# Patient Record
Sex: Female | Born: 1995 | Race: Black or African American | Hispanic: No | Marital: Single | Smoking: Never smoker
Health system: Southern US, Community
[De-identification: ages and names within clinical notes are randomized; demographics above are authoritative.]

## PROBLEM LIST (undated history)

## (undated) DIAGNOSIS — R109 Unspecified abdominal pain: Secondary | ICD-10-CM

## (undated) DIAGNOSIS — R55 Syncope and collapse: Secondary | ICD-10-CM

## (undated) DIAGNOSIS — K589 Irritable bowel syndrome without diarrhea: Secondary | ICD-10-CM

## (undated) DIAGNOSIS — G8929 Other chronic pain: Secondary | ICD-10-CM

## (undated) HISTORY — PX: NO PAST SURGERIES: SHX2092

---
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
## 2016-10-03 ENCOUNTER — Encounter (HOSPITAL_COMMUNITY): Payer: Self-pay | Admitting: *Deleted

## 2016-10-03 ENCOUNTER — Inpatient Hospital Stay (HOSPITAL_COMMUNITY)
Admission: AD | Admit: 2016-10-03 | Discharge: 2016-10-03 | Disposition: A | Discharge status: Home / Self Care | Payer: Managed Care, Other (non HMO) | Source: Ambulatory Visit | Attending: Obstetrics & Gynecology | Admitting: Obstetrics & Gynecology

## 2016-10-03 DIAGNOSIS — R112 Nausea with vomiting, unspecified: Secondary | ICD-10-CM

## 2016-10-03 DIAGNOSIS — K529 Noninfective gastroenteritis and colitis, unspecified: Secondary | ICD-10-CM | POA: Diagnosis not present

## 2016-10-03 HISTORY — DX: Unspecified abdominal pain: R10.9

## 2016-10-03 HISTORY — DX: Other chronic pain: G89.29

## 2016-10-03 LAB — URINALYSIS, ROUTINE W REFLEX MICROSCOPIC
BILIRUBIN URINE: NEGATIVE
Glucose, UA: NEGATIVE mg/dL
Hgb urine dipstick: NEGATIVE
Ketones, ur: 5 mg/dL — AB
LEUKOCYTES UA: NEGATIVE
Nitrite: NEGATIVE
PH: 6 (ref 5.0–8.0)
Protein, ur: NEGATIVE mg/dL
Specific Gravity, Urine: 1.021 (ref 1.005–1.030)

## 2016-10-03 LAB — BASIC METABOLIC PANEL
Anion gap: 8 (ref 5–15)
BUN: 10 mg/dL (ref 6–20)
CO2: 26 mmol/L (ref 22–32)
Calcium: 9.6 mg/dL (ref 8.9–10.3)
Chloride: 104 mmol/L (ref 101–111)
Creatinine, Ser: 0.77 mg/dL (ref 0.44–1.00)
GFR calc Af Amer: >60 mL/min (ref >60)
GFR calc non Af Amer: >60 mL/min (ref >60)
GLUCOSE: 107 mg/dL — AB (ref 65–99)
Potassium: 3.6 mmol/L (ref 3.5–5.1)
Sodium: 138 mmol/L (ref 135–145)

## 2016-10-03 LAB — POCT PREGNANCY, URINE: Preg Test, Ur: NEGATIVE

## 2016-10-03 LAB — CBC
HCT: 40.5 % (ref 36.0–46.0)
Hemoglobin: 14.3 g/dL (ref 12.0–15.0)
MCH: 29 pg (ref 26.0–34.0)
MCHC: 35.3 g/dL (ref 30.0–36.0)
MCV: 82.2 fL (ref 78.0–100.0)
Platelets: 348 10*3/uL (ref 150–400)
RBC: 4.93 MIL/uL (ref 3.87–5.11)
RDW: 13.3 % (ref 11.5–15.5)
WBC: 5.3 10*3/uL (ref 4.0–10.5)

## 2016-10-03 MED ORDER — ONDANSETRON 4 MG PO TBDP: 4.0000 mg | ORAL_TABLET | Freq: Three times a day (TID) | ORAL | 0 refills | Status: AC | PRN

## 2016-10-03 MED ORDER — ONDANSETRON 8 MG PO TBDP
8.0000 mg | ORAL_TABLET | Freq: Once | ORAL | Status: AC
  Administered 2016-10-03: 8 mg via ORAL
  Filled 2016-10-03: qty 1

## 2016-10-03 NOTE — MAU Note (Signed)
2 days ago awoke and had episodes of N/V Still nauseous no vomiting since then +abdominal pain--states has had chronic abdominal pain LMP 2 months ago--on Depo Denies vaginal bleeding or discharge

## 2016-10-03 NOTE — MAU Provider Note (Signed)
History     CSN: [id number]  Arrival date and time: 10/03/16 [id number]  First Provider Initiated Contact with Patient 10/03/16 [phone]      Chief Complaint  Patient presents with  . Abdominal Pain  . Nausea  . Emesis   HPI Monica DouglasKianna Torres is a 21 y.o. female who presents for abdominal pain, n/v, and diarrhea. This is a chronic problem that started over a year ago. Patient has been evaluated at [facility] & urgent care where she had a negative abdominal xray. Reports daily lower abdominal pains accompanied by brown watery stool. Has 1-2 watery stools per day. Unaffected by medication, stress, or diet change. Denies unexplained weight loss.  Nausea & vomiting began 3 days ago. Last time she vomited was yesterday but continues to be nauseated. Has not treated symptoms.  Denies fever/chills, vaginal bleeding, vaginal discharge, dysuria, hematochezia, sick contacts, recent abx usage, or recent hospitalization. Uses depo provera for contraception.   Past Medical History:  Diagnosis Date  . Chronic abdominal pain     Past Surgical History:  Procedure Laterality Date  . NO PAST SURGERIES      History reviewed. No pertinent family history.  Social History  Substance Use Topics  . Smoking status: Never Smoker  . Smokeless tobacco: Never Used  . Alcohol use No    Allergies:  Allergies  Allergen Reactions  . Benzocaine Anaphylaxis    Mouth swelling   . Fruit & Vegetable Daily [Nutritional Supplements] Rash    Prescriptions Prior to Admission  Medication Sig Dispense Refill Last Dose  . ibuprofen (ADVIL,MOTRIN) 200 MG tablet Take 400 mg by mouth every 6 (six) hours as needed for moderate pain.   06/17/2014 at Unknown time  . medroxyPROGESTERone (DEPO-PROVERA) 150 MG/ML injection Inject 150 mg into the muscle.     . [DISCONTINUED] ciprofloxacin (CIPRO) 500 MG tablet Take 500 mg by mouth 2 (two) times daily.    06/17/2014 at Unknown time  . [DISCONTINUED]  HYDROcodone-acetaminophen (NORCO/VICODIN) 5-325 MG per tablet Take 1-2 tablets by mouth every 4 (four) hours as needed for moderate pain or severe pain. 6 tablet 0   . [DISCONTINUED] metroNIDAZOLE (FLAGYL) 500 MG tablet Take 1 tablet (500 mg total) by mouth 2 (two) times daily. 14 tablet 0   . [DISCONTINUED] phenazopyridine (PYRIDIUM) 200 MG tablet Take 200 mg by mouth 2 (two) times daily as needed for pain.    06/17/2014 at Unknown time    Review of Systems  Constitutional: Negative.   Gastrointestinal: Positive for abdominal pain, diarrhea, nausea and vomiting. Negative for abdominal distention, anal bleeding, blood in stool, constipation and rectal pain.  Endocrine: Negative for cold intolerance and heat intolerance.  Genitourinary: Negative.   Musculoskeletal: Negative for myalgias.  Psychiatric/Behavioral: The patient is not nervous/anxious.    Physical Exam   Blood pressure 128/81, pulse 90, temperature 98.1 F (36.7 C), temperature source Oral, resp. rate 16, weight 134 lb 0.6 oz (60.8 kg), SpO2 100 %.  Physical Exam  Nursing note and vitals reviewed. Constitutional: She is oriented to person, place, and time. She appears well-developed and well-nourished. No distress.  HENT:  Head: Normocephalic and atraumatic.  Eyes: Conjunctivae are normal. Right eye exhibits no discharge. Left eye exhibits no discharge. No scleral icterus.  Neck: Normal range of motion.  Cardiovascular: Normal rate, regular rhythm and normal heart sounds.   No murmur heard. Respiratory: Effort normal and breath sounds normal. No respiratory distress. She has no wheezes.  GI: Soft.  Bowel sounds are normal. She exhibits no mass. There is no tenderness. There is no rebound and no guarding.  Neurological: She is alert and oriented to person, place, and time.  Skin: Skin is warm and dry. She is not diaphoretic.  Psychiatric: She has a normal mood and affect. Her behavior is normal. Judgment and thought content  normal.    MAU Course  Procedures Results for orders placed or performed during the hospital encounter of 10/03/16 (from the past 24 hour(s))  Urinalysis, Routine w reflex microscopic     Status: Abnormal   Collection Time: 10/03/16  8:47 AM  Result Value Ref Range   Color, Urine YELLOW YELLOW   APPearance CLEAR CLEAR   Specific Gravity, Urine 1.021 1.005 - 1.030   pH 6.0 5.0 - 8.0   Glucose, UA NEGATIVE NEGATIVE mg/dL   Hgb urine dipstick NEGATIVE NEGATIVE   Bilirubin Urine NEGATIVE NEGATIVE   Ketones, ur 5 (A) NEGATIVE mg/dL   Protein, ur NEGATIVE NEGATIVE mg/dL   Nitrite NEGATIVE NEGATIVE   Leukocytes, UA NEGATIVE NEGATIVE  Pregnancy, urine POC     Status: None   Collection Time: 10/03/16  9:20 AM  Result Value Ref Range   Preg Test, Ur NEGATIVE NEGATIVE  CBC     Status: None   Collection Time: 10/03/16 10:20 AM  Result Value Ref Range   WBC 5.3 4.0 - 10.5 K/uL   RBC 4.93 3.87 - 5.11 MIL/uL   Hemoglobin 14.3 12.0 - 15.0 g/dL   HCT 96.0 45.4 - 09.8 %   MCV 82.2 78.0 - 100.0 fL   MCH 29.0 26.0 - 34.0 pg   MCHC 35.3 30.0 - 36.0 g/dL   RDW 11.9 14.7 - 82.9 %   Platelets 348 150 - 400 K/uL  Basic metabolic panel     Status: Abnormal   Collection Time: 10/03/16 10:20 AM  Result Value Ref Range   Sodium 138 135 - 145 mmol/L   Potassium 3.6 3.5 - 5.1 mmol/L   Chloride 104 101 - 111 mmol/L   CO2 26 22 - 32 mmol/L   Glucose, Bld 107 (H) 65 - 99 mg/dL   BUN 10 6 - 20 mg/dL   Creatinine, Ser 5.62 0.44 - 1.00 mg/dL   Calcium 9.6 8.9 - 13.0 mg/dL   GFR calc non Af Amer >60 >60 mL/min   GFR calc Af Amer >60 >60 mL/min   Anion gap 8 5 - 15    MDM UPT negative VSS, NAD U/a CBC, BMP Zofran 8 mg ODT No vomiting in MAU & able to tolerate POs Discussed importance of f/u with PCP for further work up & possible GI referral. Pt agreeable to being discharged w/rx for antiemetic.  Assessment and Plan  A: 1. Non-intractable vomiting with nausea, unspecified vomiting type   2.  Chronic diarrhea    P: Discharge home Rx zofran Discussed reasons to return to MAU vs ED Establish care with PCP  Monica Torres 10/03/2016, 9:11 AM

## 2016-10-03 NOTE — Discharge Instructions (Signed)
Nausea and Vomiting, Adult Feeling sick to your stomach (nausea) means that your stomach is upset or you feel like you have to throw up (vomit). Feeling more and more sick to your stomach can lead to throwing up. Throwing up happens when food and liquid from your stomach are thrown up and out the mouth. Throwing up can make you feel weak and cause you to get dehydrated. Dehydration can make you tired and thirsty, make you have a dry mouth, and make it so you pee (urinate) less often. Older adults and people with other diseases or a weak defense system (immune system) are at higher risk for dehydration. If you feel sick to your stomach or if you throw up, it is important to follow instructions from your doctor about how to take care of yourself. Follow these instructions at home: Eating and drinking  Follow these instructions as told by your doctor:  Take an oral rehydration solution (ORS). This is a drink that is sold at pharmacies and stores.  Drink clear fluids in small amounts as you are able, such as:  Water.  Ice chips.  Diluted fruit juice.  Low-calorie sports drinks.  Eat bland, easy-to-digest foods in small amounts as you are able, such as:  Bananas.  Applesauce.  Rice.  Low-fat (lean) meats.  Toast.  Crackers.  Avoid fluids that have a lot of sugar or caffeine in them.  Avoid alcohol.  Avoid spicy or fatty foods. General instructions   Drink enough fluid to keep your pee (urine) clear or pale yellow.  Wash your hands often. If you cannot use soap and water, use hand sanitizer.  Make sure that all people in your home wash their hands well and often.  Take over-the-counter and prescription medicines only as told by your doctor.  Rest at home while you get better.  Watch your condition for any changes.  Breathe slowly and deeply when you feel sick to your stomach.  Keep all follow-up visits as told by your doctor. This is important. Contact a doctor  if:  You have a fever.  You cannot keep fluids down.  Your symptoms get worse.  You have new symptoms.  You feel sick to your stomach for more than two days.  You feel light-headed or dizzy.  You have a headache.  You have muscle cramps. Get help right away if:  You have pain in your chest, neck, arm, or jaw.  You feel very weak or you pass out (faint).  You throw up again and again.  You see blood in your throw-up.  Your throw-up looks like black coffee grounds.  You have bloody or black poop (stools) or poop that look like tar.  You have a very bad headache, a stiff neck, or both.  You have a rash.  You have very bad pain, cramping, or bloating in your belly (abdomen).  You have trouble breathing.  You are breathing very quickly.  Your heart is beating very quickly.  Your skin feels cold and clammy.  You feel confused.  You have pain when you pee.  You have signs of dehydration, such as:  Dark pee, hardly any pee, or no pee.  Cracked lips.  Dry mouth.  Sunken eyes.  Sleepiness.  Weakness. These symptoms may be an emergency. Do not wait to see if the symptoms will go away. Get medical help right away. Call your local emergency services (911 in the [place] Do not drive yourself to the hospital. This information   is not intended to replace advice given to you by your health care provider. Make sure you discuss any questions you have with your health care provider. Document Released: 10/15/2007 Document Revised: 11/16/2015 Document Reviewed: 01/02/2015 Elsevier Interactive Patient Education  2017 [employer]    Chronic Diarrhea Diarrhea is a condition in which a person passes frequent loose and watery stools. It can cause you to feel weak and dehydrated. Dehydration can make you tired and thirsty. It can also cause a dry mouth, decreased urination, and dark yellow urine. Diarrhea is a sign of another underlying problem, such  as:  Infection.  Medication side effects.  Dietary intolerance, such as lactose intolerance.  Conditions such as celiac disease, irritable bowel syndrome (IBS), or inflammatory bowel disease (IBD). In most cases, diarrhea lasts 2-3 days. Diarrhea that lasts longer than 4 weeks is called long-lasting (chronic) diarrhea. It is important that you treat your diarrhea as told by your health care provider. Follow these instructions at home: Follow these recommendations as told by your health care provider. Eating and drinking   Take an oral rehydration solution (ORS). This is a drink that is designed to keep you hydrated. It can be found at pharmacies and retail stores.  Drink clear fluids, such as water, ice chips, diluted fruit juice, and low-calorie sports drinks.  Follow the diet recommended by your health care provider. You may need to avoid foods that trigger diarrhea for you.  Avoid foods and beverages that contain a lot of sugar or caffeine.  Avoid alcohol.  Avoid spicy or fatty foods. General instructions   Drink enough fluid to keep your urine clear or pale yellow.  Wash your hands often and after each diarrhea episode. If soap and water are not available, use hand sanitizer.  Make sure that all people in your household wash their hands well and often.  Take over-the-counter and prescription medicines only as told by your health care provider.  If you were prescribed an antibiotic medicine, take it as told by your health care provider. Do not stop taking the antibiotic even if you start to feel better.  Rest at home while you recover.  Watch your condition for any changes.  Take a warm bath to relieve any burning or pain from frequent diarrhea episodes.  Keep all follow-up visits as told by your health care provider. This is important. Contact a health care provider if:  You have a fever.  Your diarrhea gets worse or does not get better.  You have new  symptoms.  You cannot drink fluids without vomiting.  You feel light-headed or dizzy.  You have a headache.  You have muscle cramps.  You have severe pain in the rectum. Get help right away if:  You have persistent vomiting.  You have chest pain.  You feel extremely weak or you faint.  You have bloody or black stools, or stools that look like tar.  You have severe pain, cramping, or bloating in your abdomen, or pain that stays in one place.  You have trouble breathing or you are breathing very quickly.  Your heart is beating very quickly.  Your skin feels cold and clammy.  You feel confused.  You have a severe headache.  You have signs of dehydration, such as:  Dark urine, very little urine, or no urine.  Cracked lips.  Dry mouth.  Sunken eyes.  Sleepiness.  Weakness. Summary  Chronic diarrhea is a condition in which a person passes frequent loose and  watery stools for more than 4 weeks.  Diarrhea is a sign of another underlying problem.  Drink enough fluid to keep your urine clear or pale yellow to avoid dehydration.  Wash your hands often and after each diarrhea episode. If soap and water are not available, use hand sanitizer.  It is important that you treat your diarrhea as told by your health care provider. This information is not intended to replace advice given to you by your health care provider. Make sure you discuss any questions you have with your health care provider. Document Released: 07/19/2003 Document Revised: 03/17/2016 Document Reviewed: 03/17/2016 Elsevier Interactive Patient Education  2017 [employer].

## 2016-10-21 ENCOUNTER — Emergency Department (HOSPITAL_COMMUNITY)
Admission: EM | Admit: 2016-10-21 | Discharge: 2016-10-21 | Disposition: A | Discharge status: Home / Self Care | Payer: Managed Care, Other (non HMO) | Attending: Emergency Medicine | Admitting: Emergency Medicine

## 2016-10-21 ENCOUNTER — Encounter (HOSPITAL_COMMUNITY): Payer: Self-pay | Admitting: Nurse Practitioner

## 2016-10-21 ENCOUNTER — Emergency Department (HOSPITAL_COMMUNITY): Payer: Managed Care, Other (non HMO)

## 2016-10-21 DIAGNOSIS — R0789 Other chest pain: Secondary | ICD-10-CM | POA: Insufficient documentation

## 2016-10-21 DIAGNOSIS — R071 Chest pain on breathing: Secondary | ICD-10-CM | POA: Diagnosis present

## 2016-10-21 DIAGNOSIS — Z79899 Other long term (current) drug therapy: Secondary | ICD-10-CM | POA: Insufficient documentation

## 2016-10-21 HISTORY — DX: Syncope and collapse: R55

## 2016-10-21 HISTORY — DX: Irritable bowel syndrome, unspecified: K58.9

## 2016-10-21 LAB — BASIC METABOLIC PANEL
ANION GAP: 7 (ref 5–15)
BUN: 8 mg/dL (ref 6–20)
CALCIUM: 9.6 mg/dL (ref 8.9–10.3)
CO2: 25 mmol/L (ref 22–32)
Chloride: 105 mmol/L (ref 101–111)
Creatinine, Ser: 0.82 mg/dL (ref 0.44–1.00)
Glucose, Bld: 117 mg/dL — ABNORMAL HIGH (ref 65–99)
Potassium: 3.3 mmol/L — ABNORMAL LOW (ref 3.5–5.1)
Sodium: 137 mmol/L (ref 135–145)

## 2016-10-21 LAB — CBC
HCT: 38.6 % (ref 36.0–46.0)
Hemoglobin: 13.4 g/dL (ref 12.0–15.0)
MCH: 28.8 pg (ref 26.0–34.0)
MCHC: 34.7 g/dL (ref 30.0–36.0)
MCV: 82.8 fL (ref 78.0–100.0)
Platelets: 366 10*3/uL (ref 150–400)
RBC: 4.66 MIL/uL (ref 3.87–5.11)
RDW: 13 % (ref 11.5–15.5)
WBC: 7.1 10*3/uL (ref 4.0–10.5)

## 2016-10-21 LAB — I-STAT TROPONIN, ED: TROPONIN I, POC: 0 ng/mL (ref 0.00–0.08)

## 2016-10-21 LAB — D-DIMER, QUANTITATIVE: D-Dimer, Quant: <0.27 ug/mL-FEU (ref 0.00–0.50)

## 2016-10-21 NOTE — ED Provider Notes (Signed)
[facility]-EMERGENCY DEPT Provider Note   CSN: [id number] Arrival date & time: 10/21/16  1749     History   Chief Complaint Chief Complaint  Patient presents with  . Chest Pain    HPI Monica Torres is a 21 y.o. female.  HPI  Patient presents to ED for midsternal chest pain that started about 4 days ago. She states that it has been constant and worse with palpation. She also reports that it sometimes worse when she takes a deep breath and feels like a squeezing sensation. Reports some lightheadedness with working out. Denies any syncope or head injury. She reported that when symptoms began she had pain radiating to her left arm as well as her left calf. She denies any history of DVT or PE, family history of DVT or PE, family history of sudden cardiac death, hemoptysis, shortness of breath. Patient is currently on birth control. Also reports intermittent palpitations at times. Denies any trouble walking, fever, appetite changes, diarrhea, abdominal pain, nausea, vomiting.  Past Medical History:  Diagnosis Date  . Chronic abdominal pain   . IBS (irritable bowel syndrome)   . Syncope     There are no active problems to display for this patient.   Past Surgical History:  Procedure Laterality Date  . NO PAST SURGERIES      OB History    No data available       Home Medications    Prior to Admission medications   Medication Sig Start Date End Date Taking? Authorizing Provider  medroxyPROGESTERone (DEPO-PROVERA) 150 MG/ML injection Inject 150 mg into the muscle every 3 (three) months.    Yes [provider]  ondansetron (ZOFRAN ODT) 4 MG disintegrating tablet Take 1 tablet (4 mg total) by mouth every 8 (eight) hours as needed for nausea or vomiting. Patient taking differently: Take 4 mg by mouth every 8 (eight) hours as needed for nausea or vomiting. DISSOLVE IN THE MOUTH 10/03/16  Yes Judeth HornLawrence, Erin, NP    Family History No family history on file.  Social  History Social History  Substance Use Topics  . Smoking status: Never Smoker  . Smokeless tobacco: Never Used  . Alcohol use No     Allergies   Benzocaine; Apple; and Citrus   Review of Systems Review of Systems  Constitutional: Negative for appetite change, chills and fever.  HENT: Negative for ear pain, rhinorrhea, sneezing and sore throat.   Eyes: Negative for photophobia and visual disturbance.  Respiratory: Negative for cough, chest tightness, shortness of breath and wheezing.   Cardiovascular: Positive for chest pain and palpitations.  Gastrointestinal: Negative for abdominal pain, blood in stool, constipation, diarrhea, nausea and vomiting.  Genitourinary: Negative for dysuria, hematuria and urgency.  Musculoskeletal: Negative for myalgias.  Skin: Negative for rash.  Neurological: Negative for dizziness, weakness and light-headedness.     Physical Exam Updated Vital Signs BP 124/85   Pulse 91   Temp 98.4 F (36.9 C) (Oral)   Resp 17   Ht 5\' 2"  (1.575 m)   Wt 60.8 kg (134 lb)   LMP 09/22/2016 Comment: Depo shot  SpO2 100%   BMI 24.51 kg/m   Physical Exam  Constitutional: She is oriented to person, place, and time. She appears well-developed and well-nourished. No distress.  HENT:  Head: Normocephalic and atraumatic.  Nose: Nose normal.  Eyes: Conjunctivae and EOM are normal. Pupils are equal, round, and reactive to light. Right eye exhibits no discharge. Left eye exhibits no discharge. No scleral  icterus.  Neck: Normal range of motion. Neck supple.  Cardiovascular: Normal rate, regular rhythm, normal heart sounds and intact distal pulses.  Exam reveals no gallop and no friction rub.   No murmur heard. Pulmonary/Chest: Effort normal and breath sounds normal. No respiratory distress.  Abdominal: Soft. Bowel sounds are normal. She exhibits no distension. There is no tenderness. There is no guarding.  Musculoskeletal: Normal range of motion. She exhibits no  edema.  No calf tenderness present. No color or temperature change noted. 2+ pulses bilaterally. Negative Homans sign bilaterally.  Neurological: She is alert and oriented to person, place, and time. No cranial nerve deficit or sensory deficit. She exhibits normal muscle tone. Coordination normal.  Skin: Skin is warm and dry. No rash noted.  Psychiatric: She has a normal mood and affect.  Nursing note and vitals reviewed.    ED Treatments / Results  Labs (all labs ordered are listed, but only abnormal results are displayed) Labs Reviewed  BASIC METABOLIC PANEL - Abnormal; Notable for the following:       Result Value   Potassium 3.3 (*)    Glucose, Bld 117 (*)    All other components within normal limits  CBC  D-DIMER, QUANTITATIVE (NOT AT [facility])  [name], ED    EKG  EKG Interpretation None       Radiology Dg Chest 2 View  Result Date: 10/21/2016 CLINICAL DATA:  Left-sided and central chest pain with dyspnea x4 days. EXAM: CHEST  2 VIEW COMPARISON:  None. FINDINGS: The heart size and mediastinal contours are within normal limits. Both lungs are clear. The visualized skeletal structures are unremarkable. IMPRESSION: No active cardiopulmonary disease. Electronically Signed   By: Tollie Eth M.D.   On: 10/21/2016 19:17    Procedures Procedures (including critical care time)  Medications Ordered in ED Medications - No data to display   Initial Impression / Assessment and Plan / ED Course  I have reviewed the triage vital signs and the nursing notes.  Pertinent labs & imaging results that were available during my care of the patient were reviewed by me and considered in my medical decision making (see chart for details).     Patient presents to ED for complaints of chest pain. She is otherwise healthy and is not on any daily medications or any history of heart disease. She denies syncope or head injury. She states that pain began about 4 days ago and is constant.  Troponin is negative 1. CBC unremarkable. CMP showed mild hypokalemia at 3.3. EKG showed normal sinus rhythm with no evidence of arrhythmia including WPW, Brugada, LVH, long QT. Chest x-ray negative for acute cardiopulmonary process. Patient is afebrile with no history of fever. Has not tried any medications for the pain. Exam not concerning for DVT. No focal deficits on neurological exam. Because patient is on birth control, she is not PERC negative. D-dimer obtained and was negative. I advised her that her negative workup is reassuring and that she should take ibuprofen for her possible musculoskeletal pain that is occurring. She has a history of working out frequently and lifting weights. I also advised her to stay hydrated with water and Gatorade during her workout that she reports some lightheadedness while she is at the gym. Patient appears stable for discharge at this time. She has no other complaints. Strict return precautions given.   Final Clinical Impressions(s) / ED Diagnoses   Final diagnoses:  Chest wall pain    New Prescriptions Discharge Medication  List as of 10/21/2016 10:11 PM       Dietrich Pates, PA-C 10/21/16 2239    Linwood Dibbles, MD 10/22/16 0040

## 2016-10-21 NOTE — ED Triage Notes (Signed)
Patient notes upper chest pain started 4 days ago that has been constant. Patient states she doesn't note anything that makes it better. Patient denies dyspnea but small sharp pain when she takes a deep breath. Patient also notes intermittent left calf pain and intermittent lightheadedness. Patient denies nausea, dizziness, syncope, smoking, long travels. Patient states pain is worse after exercise and after eating. Patient appears in NAD, able tos peak in full sentences.

## 2016-10-21 NOTE — Discharge Instructions (Signed)
Take ibuprofen as needed for pain and inflammation. Follow-up with PCP for further evaluation. Return to ED for worsening chest pain, trouble breathing, coughing up blood, leg pain, loss of consciousness, vision changes.

## 2016-10-21 NOTE — ED Notes (Signed)
ED Provider at bedside. 

## 2018-05-16 IMAGING — DX DG CHEST 2V
2 series · 2 of 2 positions shown · non-contrast
Comparison: None.

CLINICAL DATA: Left-sided and central chest pain with dyspnea x4
days.

EXAM:
CHEST  2 VIEW

[chest pa]
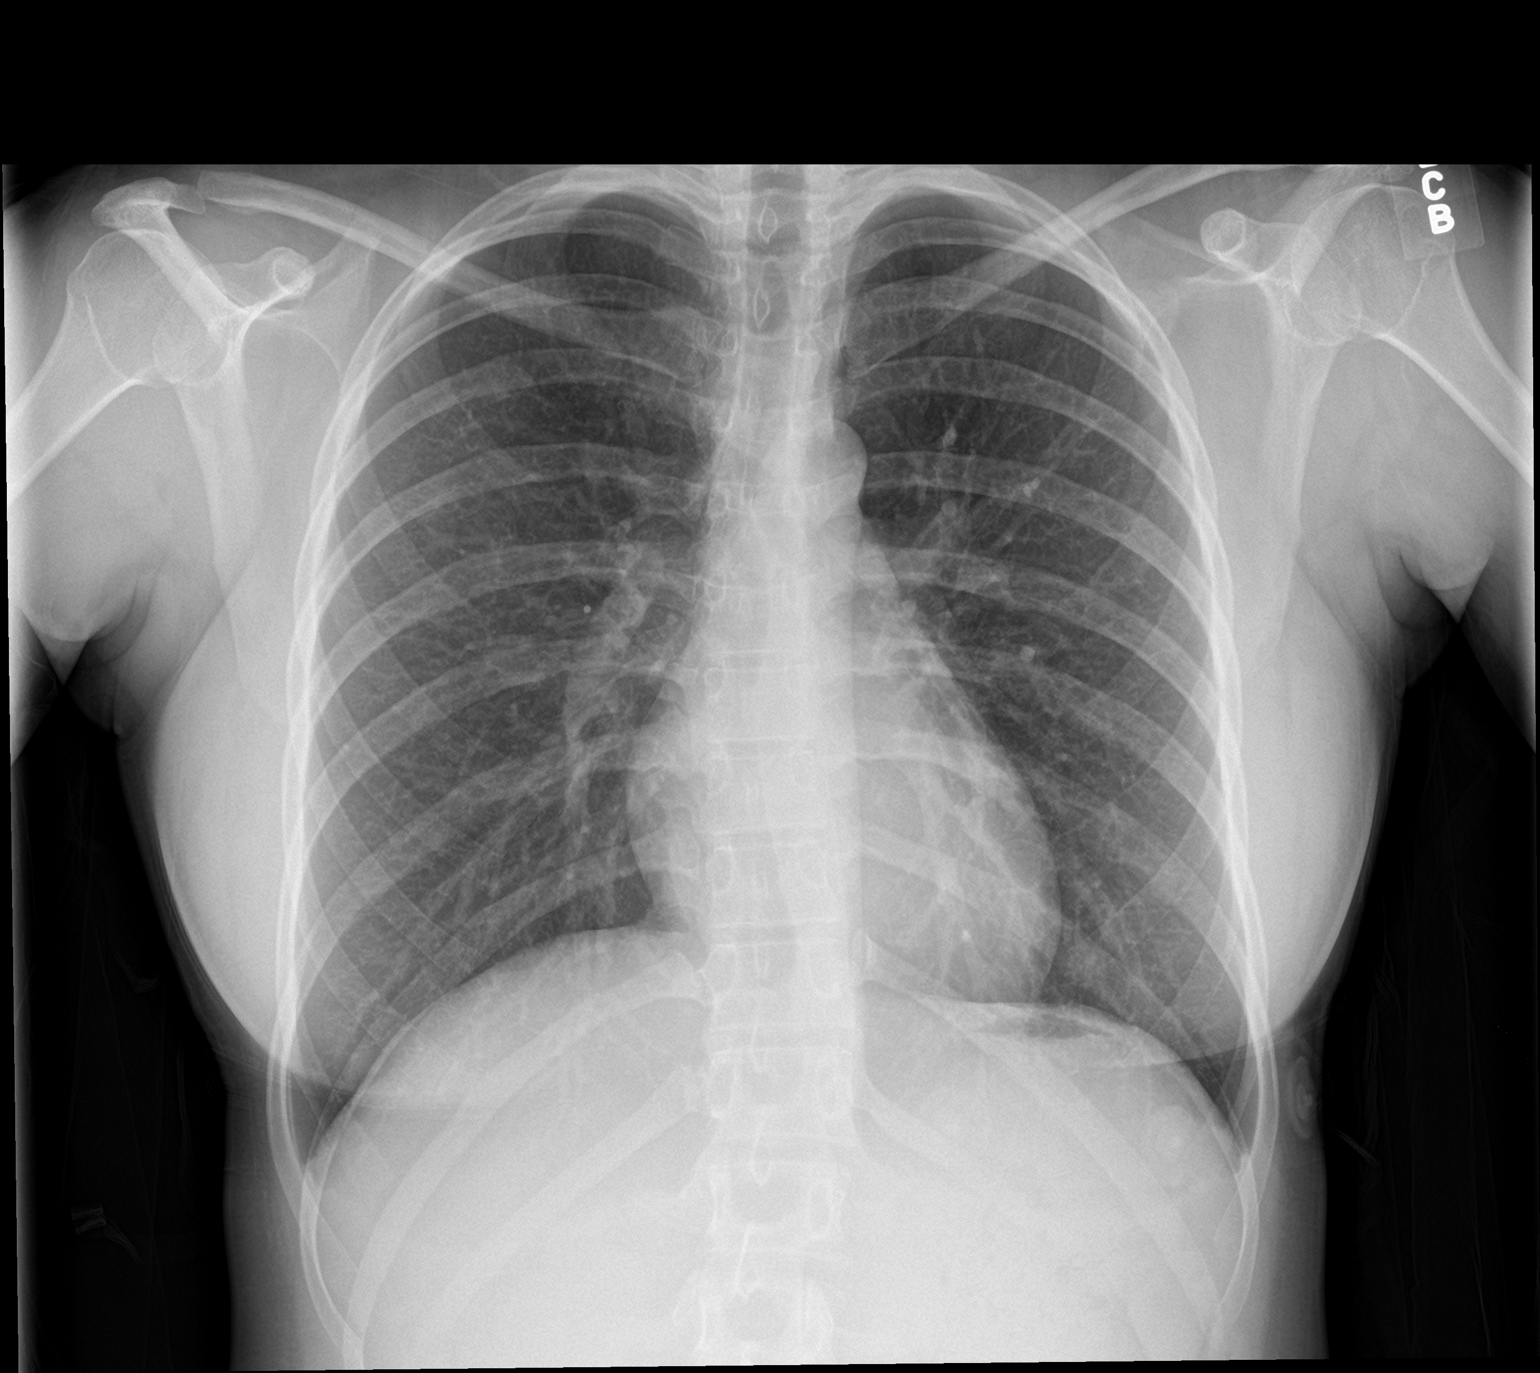

[chest lat]
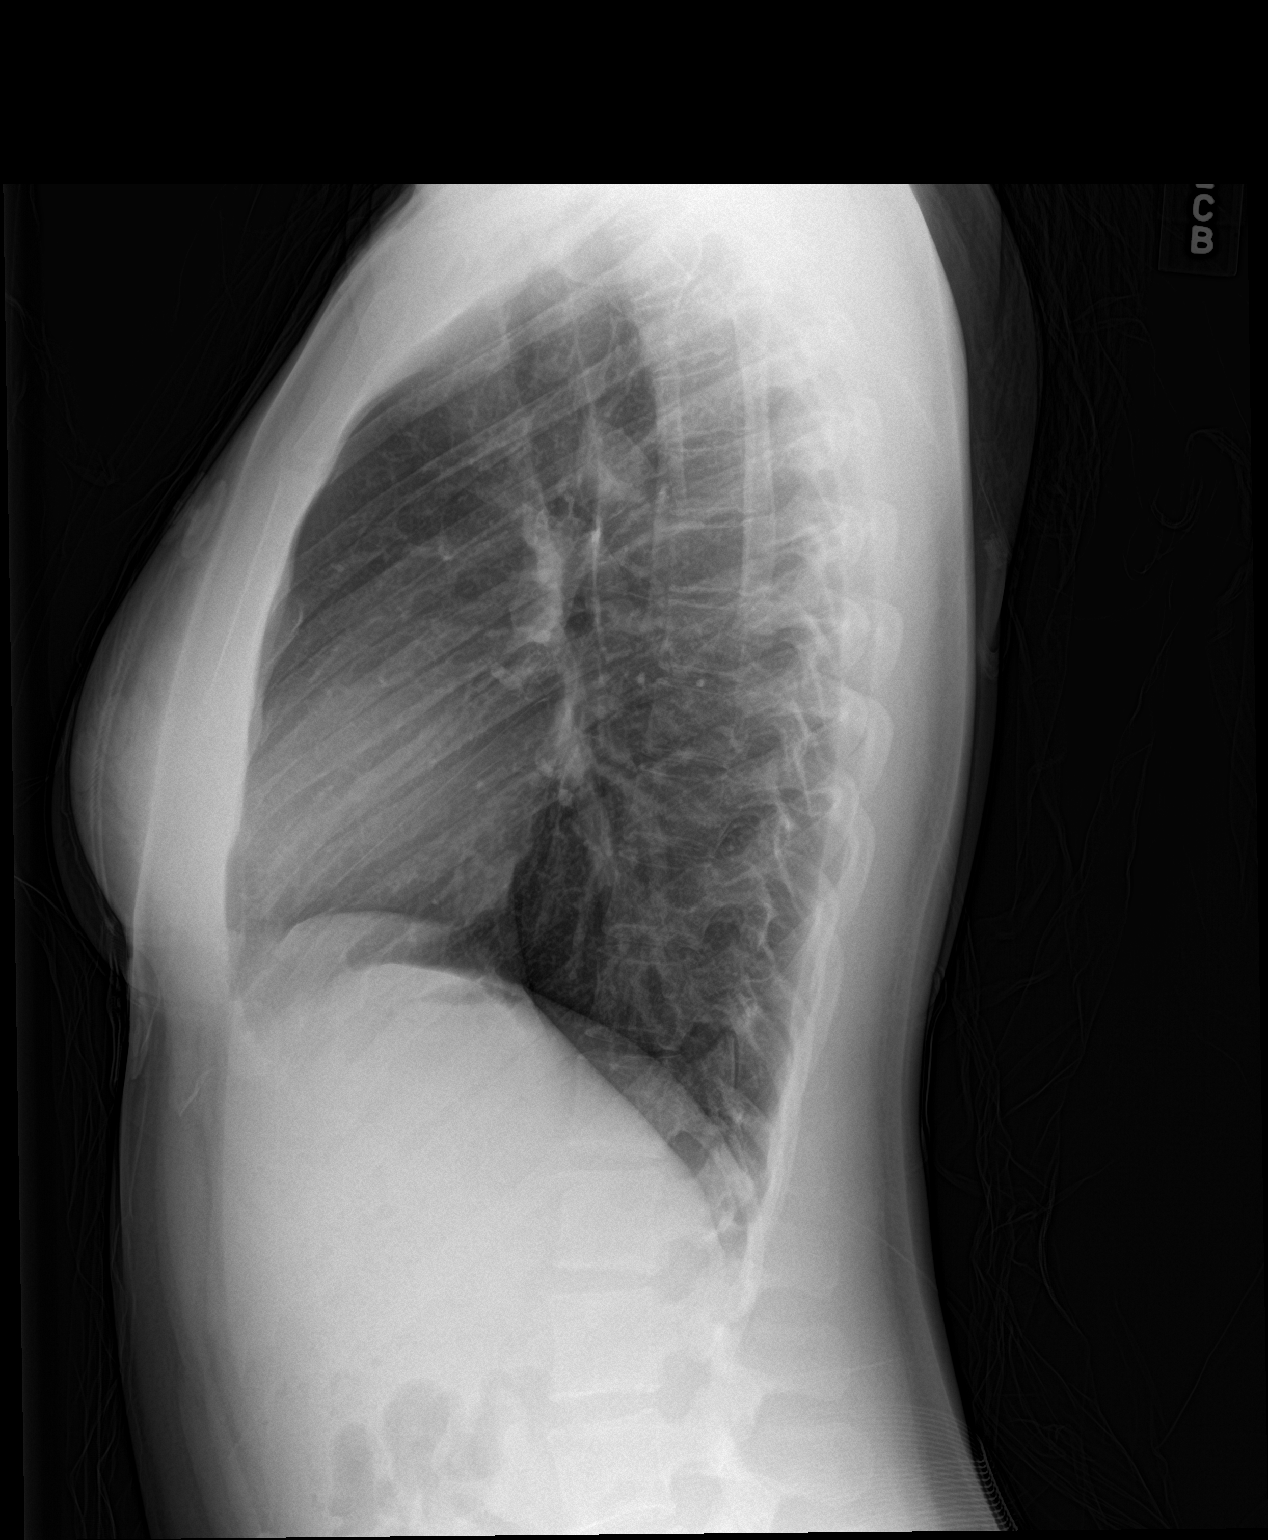

[2 of 2 positions shown; findings below may reference images not displayed]

FINDINGS: The heart size and mediastinal contours are within normal limits.
Both lungs are clear. The visualized skeletal structures are
unremarkable.
IMPRESSION: No active cardiopulmonary disease.

## 2019-03-07 ENCOUNTER — Encounter: Payer: Managed Care, Other (non HMO) | Admitting: Women's Health

## 2019-03-07 ENCOUNTER — Encounter: Payer: Self-pay | Admitting: Women's Health

## 2019-04-18 ENCOUNTER — Ambulatory Visit (INDEPENDENT_AMBULATORY_CARE_PROVIDER_SITE_OTHER): Payer: Managed Care, Other (non HMO)

## 2019-04-18 ENCOUNTER — Encounter (HOSPITAL_COMMUNITY): Payer: Self-pay

## 2019-04-18 ENCOUNTER — Other Ambulatory Visit: Payer: Self-pay

## 2019-04-18 ENCOUNTER — Ambulatory Visit (HOSPITAL_COMMUNITY)
Admission: EM | Admit: 2019-04-18 | Discharge: 2019-04-18 | Disposition: A | Discharge status: Home / Self Care | Payer: Managed Care, Other (non HMO) | Attending: Emergency Medicine | Admitting: Emergency Medicine

## 2019-04-18 DIAGNOSIS — R079 Chest pain, unspecified: Secondary | ICD-10-CM

## 2019-04-18 DIAGNOSIS — F1290 Cannabis use, unspecified, uncomplicated: Secondary | ICD-10-CM

## 2019-04-18 MED ORDER — NAPROXEN 375 MG PO TABS: 375.0000 mg | ORAL_TABLET | Freq: Two times a day (BID) | ORAL | 0 refills | Status: AC

## 2019-04-18 NOTE — ED Triage Notes (Signed)
Breathing is difficulty since yesterday pt. states she has been having trouble with this for 2 months.

## 2019-04-18 NOTE — Discharge Instructions (Signed)
Your ekg and chest xray looks well today.  It does seem that smoking is likely triggering your chest discomfort.  Try naproxen twice a day, take with food.  I would like you to try not smoking for the next week to see if symptoms improve. If persistent or any worsening please go to the ER as you will likely need further evaluation.  If worsening of shortness of breath , fevers, palpitations or otherwise worsening please go to the ER.

## 2019-04-18 NOTE — ED Provider Notes (Signed)
[facility]    CSN: [id number] Arrival date & time: 04/18/19  1850      History   Chief Complaint Chief Complaint  Patient presents with  . Chest Pain    HPI Monica Torres is a 23 y.o. female.   Monica Torres presents with complaints of left chest pain, has been ongoing for months now but worse since last night. She feels shortness of breath at times, worse when she was out in the cold. Laying flat seems to worsen the pain. No specific palpitations. No fever. No cough. Some congestion. Not associated with eating. No leg pain or swelling. No recent travel. She has been on patch form of birth control, stopped this week. She smokes marijuana, approximately 3 times a week, last was this morning. States pain does seem to worsen after smoking marijuana, last smoked this morning. No personal or family cardiac history. Per chart review worked up for chest pain, which was more sternal, 10/2016. States she has felt some increased stress. History  Of syncope which was not found to have cardiac source, per patient. No previous blood clots.      ROS per HPI, negative if not otherwise mentioned.      Past Medical History:  Diagnosis Date  . Chronic abdominal pain   . IBS (irritable bowel syndrome)   . Syncope     There are no active problems to display for this patient.   Past Surgical History:  Procedure Laterality Date  . NO PAST SURGERIES      OB History   No obstetric history on file.      Home Medications    Prior to Admission medications   Medication Sig Start Date End Date Taking? Authorizing Provider  medroxyPROGESTERone (DEPO-PROVERA) 150 MG/ML injection Inject 150 mg into the muscle every 3 (three) months.     [provider]  naproxen (NAPROSYN) 375 MG tablet Take 1 tablet (375 mg total) by mouth 2 (two) times daily. 04/18/19   Zigmund Gottron, NP  ondansetron (ZOFRAN ODT) 4 MG disintegrating tablet Take 1 tablet (4 mg total) by mouth every 8  (eight) hours as needed for nausea or vomiting. Patient taking differently: Take 4 mg by mouth every 8 (eight) hours as needed for nausea or vomiting. DISSOLVE IN THE MOUTH 10/03/16   Jorje Guild, NP    Family History Family History  Problem Relation Age of Onset  . Hypertension Father   . Healthy Mother     Social History Social History   Tobacco Use  . Smoking status: Never Smoker  . Smokeless tobacco: Never Used  Substance Use Topics  . Alcohol use: No  . Drug use: Yes    Types: Marijuana     Allergies   Benzocaine, Apple, and Citrus   Review of Systems Review of Systems   Physical Exam Triage Vital Signs ED Triage Vitals  Enc Vitals Group     BP 04/18/19 1911 115/75     Pulse Rate 04/18/19 1911 82     Resp 04/18/19 1911 16     Temp 04/18/19 1911 98.8 F (37.1 C)     Temp Source 04/18/19 1911 Oral     SpO2 04/18/19 1911 99 %     Weight 04/18/19 1907 125 lb (56.7 kg)     Height --      Head Circumference --      Peak Flow --      Pain Score 04/18/19 1907 6  Pain Loc --      Pain Edu? --      Excl. in GC? --    No data found.  Updated Vital Signs BP 115/75 (BP Location: Right Arm)   Pulse 82   Temp 98.8 F (37.1 C) (Oral)   Resp 16   Wt 125 lb (56.7 kg)   LMP 04/18/2019   SpO2 99%   BMI 22.86 kg/m   Visual Acuity Right Eye Distance:   Left Eye Distance:   Bilateral Distance:    Right Eye Near:   Left Eye Near:    Bilateral Near:     Physical Exam Constitutional:      General: She is not in acute distress.    Appearance: She is well-developed.  Cardiovascular:     Rate and Rhythm: Normal rate and regular rhythm.     Heart sounds: Normal heart sounds.  Pulmonary:     Effort: Pulmonary effort is normal.     Breath sounds: Normal breath sounds.     Comments: No work of breathing, no cough, speaking clearly and calmly  Chest:       Comments: Left anterior chest wall with pain, minimal tenderness on palpation Skin:    General:  Skin is warm and dry.  Neurological:     Mental Status: She is alert and oriented to person, place, and time.    EKG:  NSR rate of 66. Previous EKG was available for review. No stwave changes as interpreted by me.    UC Treatments / Results  Labs (all labs ordered are listed, but only abnormal results are displayed) Labs Reviewed - No data to display  EKG   Radiology Dg Chest 2 View  Result Date: 04/18/2019 CLINICAL DATA:  Left-sided chest pain for 2 months. Recreational smoker. EXAM: CHEST - 2 VIEW COMPARISON:  10/21/2016 FINDINGS: Midline trachea.  Normal heart size and mediastinal contours. Sharp costophrenic angles.  No pneumothorax.  Clear lungs. IMPRESSION: No active cardiopulmonary disease. Electronically Signed   By: Jeronimo Greaves M.D.   On: 04/18/2019 20:20    Procedures Procedures (including critical care time)  Medications Ordered in UC Medications - No data to display  Initial Impression / Assessment and Plan / UC Course  I have reviewed the triage vital signs and the nursing notes.  Pertinent labs & imaging results that were available during my care of the patient were reviewed by me and considered in my medical decision making (see chart for details).     Non toxic. Benign physical exam. Vitals stable. No tachycardia. No leg pain or swelling, no immobilization. Patient does smoke marijuana, this seems to trigger her symptoms. She is on birth control as well. PE considered. Symptoms have not necessarily been worsening and have been persistent for some time with stable vitals. Discussed this at length with patient. Encouraged to trial stopping smoking for the next week with use of nsaids for pain, to see if pain improves. To go to ER for any persistent or worsening of symptoms as may need PE workup. Patient verbalized understanding and agreeable to plan.  Ambulatory out of clinic without difficulty.    Final Clinical Impressions(s) / UC Diagnoses   Final diagnoses:   Nonspecific chest pain     Discharge Instructions     Your ekg and chest xray looks well today.  It does seem that smoking is likely triggering your chest discomfort.  Try naproxen twice a day, take with food.  I would like you  to try not smoking for the next week to see if symptoms improve. If persistent or any worsening please go to the ER as you will likely need further evaluation.  If worsening of shortness of breath , fevers, palpitations or otherwise worsening please go to the ER.    ED Prescriptions    Medication Sig Dispense Auth. Provider   naproxen (NAPROSYN) 375 MG tablet Take 1 tablet (375 mg total) by mouth 2 (two) times daily. 20 tablet Georgetta HaberBurky, Jaymarion Trombly B, NP     PDMP not reviewed this encounter.   Georgetta HaberBurky, Anedra Penafiel B, NP 04/18/19 2041

## 2020-06-01 ENCOUNTER — Encounter (HOSPITAL_COMMUNITY): Payer: Self-pay

## 2020-06-01 ENCOUNTER — Emergency Department (HOSPITAL_COMMUNITY)
Admission: EM | Admit: 2020-06-01 | Discharge: 2020-06-01 | Disposition: A | Discharge status: Home / Self Care | Payer: Managed Care, Other (non HMO) | Attending: Emergency Medicine | Admitting: Emergency Medicine

## 2020-06-01 ENCOUNTER — Other Ambulatory Visit: Payer: Self-pay

## 2020-06-01 DIAGNOSIS — M549 Dorsalgia, unspecified: Secondary | ICD-10-CM | POA: Insufficient documentation

## 2020-06-01 DIAGNOSIS — Z20822 Contact with and (suspected) exposure to covid-19: Secondary | ICD-10-CM | POA: Insufficient documentation

## 2020-06-01 DIAGNOSIS — M791 Myalgia, unspecified site: Secondary | ICD-10-CM | POA: Diagnosis not present

## 2020-06-01 DIAGNOSIS — R519 Headache, unspecified: Secondary | ICD-10-CM | POA: Diagnosis not present

## 2020-06-01 LAB — CBC WITH DIFFERENTIAL/PLATELET
Abs Immature Granulocytes: 0.02 10*3/uL (ref 0.00–0.07)
Basophils Absolute: 0 10*3/uL (ref 0.0–0.1)
Basophils Relative: 1 %
Eosinophils Absolute: 0.1 10*3/uL (ref 0.0–0.5)
Eosinophils Relative: 1 %
HCT: 31.7 % — ABNORMAL LOW (ref 36.0–46.0)
Hemoglobin: 11.2 g/dL — ABNORMAL LOW (ref 12.0–15.0)
Immature Granulocytes: 1 %
Lymphocytes Relative: 34 %
Lymphs Abs: 1.3 10*3/uL (ref 0.7–4.0)
MCH: 29.9 pg (ref 26.0–34.0)
MCHC: 35.3 g/dL (ref 30.0–36.0)
MCV: 84.8 fL (ref 80.0–100.0)
Monocytes Absolute: 0.6 10*3/uL (ref 0.1–1.0)
Monocytes Relative: 16 %
Neutro Abs: 1.7 10*3/uL (ref 1.7–7.7)
Neutrophils Relative %: 47 %
Platelets: 181 10*3/uL (ref 150–400)
RBC: 3.74 MIL/uL — ABNORMAL LOW (ref 3.87–5.11)
RDW: 11.9 % (ref 11.5–15.5)
WBC: 3.7 10*3/uL — ABNORMAL LOW (ref 4.0–10.5)
nRBC: 0 % (ref 0.0–0.2)

## 2020-06-01 LAB — I-STAT CHEM 8, ED
BUN: 7 mg/dL (ref 6–20)
Calcium, Ion: 1.24 mmol/L (ref 1.15–1.40)
Chloride: 103 mmol/L (ref 98–111)
Creatinine, Ser: 0.4 mg/dL — ABNORMAL LOW (ref 0.44–1.00)
Glucose, Bld: 80 mg/dL (ref 70–99)
HCT: 31 % — ABNORMAL LOW (ref 36.0–46.0)
Hemoglobin: 10.5 g/dL — ABNORMAL LOW (ref 12.0–15.0)
Potassium: 3.4 mmol/L — ABNORMAL LOW (ref 3.5–5.1)
Sodium: 141 mmol/L (ref 135–145)
TCO2: 26 mmol/L (ref 22–32)

## 2020-06-01 LAB — HCG, QUANTITATIVE, PREGNANCY: hCG, Beta Chain, Quant, S: 212 m[IU]/mL — ABNORMAL HIGH (ref <5)

## 2020-06-01 MED ORDER — ACETAMINOPHEN 500 MG PO TABS: 500.0000 mg | ORAL_TABLET | Freq: Four times a day (QID) | ORAL | 0 refills | Status: AC | PRN

## 2020-06-01 MED ORDER — ACETAMINOPHEN 500 MG PO TABS
1000.0000 mg | ORAL_TABLET | Freq: Once | ORAL | Status: AC
Start: 2020-06-01 — End: 2020-06-01
  Administered 2020-06-01: 1000 mg via ORAL
  Filled 2020-06-01: qty 2

## 2020-06-01 NOTE — ED Provider Notes (Signed)
[facility]-EMERGENCY DEPT Provider Note   CSN: [id number] Arrival date & time: 06/01/20  1817     History Chief Complaint  Patient presents with  . Back Pain  . Chills    Monica Torres is a 25 y.o. female.  The history is provided by the patient. No language interpreter was used.  Back Pain     25 year old female significant history of IBS, chronic abdominal pain, previously had a surgical abortion 3 days ago presenting complaining of generalized body aches.  Patient is a G4, P0 with elective abortion 3 days ago.  She mention for the past 3 days she has had headache, neck pain, body aches, night sweats, chills, and overall not feeling well.  She did endorse some mild vaginal bleeding since abortion but no vaginal discharge.  She does not complain of any runny nose sneezing coughing sore throat loss of taste or smell dysuria hematuria.  No abdominal pain no vaginal pain. she denies any cough shortness of breath or chest pain.  She has had COVID vaccination.  She was exposed to COVID approximately a week ago.  She did take some over-the-counter medication at home without relief.     Past Medical History:  Diagnosis Date  . Chronic abdominal pain   . IBS (irritable bowel syndrome)   . Syncope     There are no problems to display for this patient.   Past Surgical History:  Procedure Laterality Date  . NO PAST SURGERIES       OB History   No obstetric history on file.     Family History  Problem Relation Age of Onset  . Hypertension Father   . Healthy Mother     Social History   Tobacco Use  . Smoking status: Never Smoker  . Smokeless tobacco: Never Used  Substance Use Topics  . Alcohol use: No  . Drug use: Yes    Types: Marijuana    Home Medications Prior to Admission medications   Medication Sig Start Date End Date Taking? Authorizing Provider  medroxyPROGESTERone (DEPO-PROVERA) 150 MG/ML injection Inject 150 mg into the muscle  every 3 (three) months.     [provider]  naproxen (NAPROSYN) 375 MG tablet Take 1 tablet (375 mg total) by mouth 2 (two) times daily. 04/18/19   Georgetta Haber, NP  ondansetron (ZOFRAN ODT) 4 MG disintegrating tablet Take 1 tablet (4 mg total) by mouth every 8 (eight) hours as needed for nausea or vomiting. Patient taking differently: Take 4 mg by mouth every 8 (eight) hours as needed for nausea or vomiting. DISSOLVE IN THE MOUTH 10/03/16   [name], NP    Allergies    Benzocaine, Apple, and Citrus  Review of Systems   Review of Systems  Musculoskeletal: Positive for back pain.  All other systems reviewed and are negative.   Physical Exam Updated Vital Signs BP 115/67   Pulse 96   Temp 97.9 F (36.6 C) (Oral)   Resp 17   SpO2 100%   Physical Exam Vitals and nursing note reviewed.  Constitutional:      General: She is not in acute distress.    Appearance: She is well-developed and well-nourished.     Comments: Well-appearing female sitting in the chair, playing the phone appears to be in no acute discomfort.  HENT:     Head: Normocephalic and atraumatic.     Nose: Nose normal.     Mouth/Throat:     Mouth: Mucous  membranes are moist.  Eyes:     Conjunctiva/sclera: Conjunctivae normal.     Pupils: Pupils are equal, round, and reactive to light.  Cardiovascular:     Rate and Rhythm: Normal rate and regular rhythm.     Pulses: Normal pulses.     Heart sounds: Normal heart sounds.  Pulmonary:     Effort: Pulmonary effort is normal.  Abdominal:     Palpations: Abdomen is soft.     Tenderness: There is no abdominal tenderness.  Musculoskeletal:        General: Tenderness (Mild generalized tenderness to palpation of neck and back and arms without focal point tenderness.  No nuchal rigidity) present.     Cervical back: Normal range of motion and neck supple. No rigidity.  Skin:    General: Skin is warm.     Findings: No rash.  Neurological:     Mental  Status: She is alert and oriented to person, place, and time.  Psychiatric:        Mood and Affect: Mood and affect and mood normal.     ED Results / Procedures / Treatments   Labs (all labs ordered are listed, but only abnormal results are displayed) Labs Reviewed  CBC WITH DIFFERENTIAL/PLATELET - Abnormal; Notable for the following components:      Result Value   WBC 3.7 (*)    RBC 3.74 (*)    Hemoglobin 11.2 (*)    HCT 31.7 (*)    All other components within normal limits  HCG, QUANTITATIVE, PREGNANCY - Abnormal; Notable for the following components:   hCG, Beta Chain, Quant, S 212 (*)    All other components within normal limits  I-STAT CHEM 8, ED - Abnormal; Notable for the following components:   Potassium 3.4 (*)    Creatinine, Ser 0.40 (*)    Hemoglobin 10.5 (*)    HCT 31.0 (*)    All other components within normal limits  SARS CORONAVIRUS 2 (TAT 6-24 HRS)    EKG None  Radiology No results found.  Procedures Procedures (including critical care time)  Medications Ordered in ED Medications  acetaminophen (TYLENOL) tablet 1,000 mg (1,000 mg Oral Given 06/01/20 2039)    ED Course  I have reviewed the triage vital signs and the nursing notes.  Pertinent labs & imaging results that were available during my care of the patient were reviewed by me and considered in my medical decision making (see chart for details).    MDM Rules/Calculators/A&P                          BP 115/67   Pulse 96   Temp 97.9 F (36.6 C) (Oral)   Resp 17   SpO2 100%   Final Clinical Impression(s) / ED Diagnoses Final diagnoses:  Myalgia    Rx / DC Orders ED Discharge Orders         Ordered    acetaminophen (TYLENOL) 500 MG tablet  Every 6 hours PRN        06/01/20 2222         8:05 PM Patient here with headache neck pain body aches for the past 2 to 3 days.  Recently had a elective surgical abortion at 20 weeks.  She does not have any symptoms abdominal pain severe  vaginal bleeding to suggest complication from her surgical abortion.  She does not have any nuchal rigidity, light or sound sensitivity to suggest meningitis.  She is afebrile, vital signs stable.  She has been vaccinated for COVID-19.  Her symptoms may be signs of early COVID infection.  Will obtain COVID test and check basic labs.  Tylenol given.  9:54 PM Beta hCG is 212, this is residual from recent surgical abortion.  WBC is 3.7, Hgb 11.2, electrolyte panels are reassuring. COVID pending.   Monica Torres was evaluated in Emergency Department on 06/01/2020 for the symptoms described in the history of present illness. She was evaluated in the context of the global COVID-19 pandemic, which necessitated consideration that the patient might be at risk for infection with the SARS-CoV-2 virus that causes COVID-19. Institutional protocols and algorithms that pertain to the evaluation of patients at risk for COVID-19 are in a state of rapid change based on information released by regulatory bodies including the CDC and federal and state organizations. These policies and algorithms were followed during the patient's care in the ED.    Fayrene Helper, PA-C 06/01/20 2224    Monica Pander, MD 06/01/20 [phone]

## 2020-06-01 NOTE — ED Triage Notes (Signed)
Pt reports she had a surgical abortion on Tuesday and since then has had night sweats and chills. Pt reports developing a headache which progressed to neck pain and is now down her back. Pt denies abdominal pain and abnormal discharge.

## 2020-06-01 NOTE — Discharge Instructions (Addendum)
You have been evaluated for your symptoms.  This could be early signs of COVID infection.  Please check MyChart, link below for results of your COVID test within the next 24 hours.  If test positive, follow instruction below.  Also call and follow-up closely with your provider who performed surgical abortion for close follow-up  Recommendations for at home COVID-19 symptoms management:  Please continue isolation at home. Call [phone] to see whether you might be eligible for therapeutic antibody infusions (leave your name and they will call you back).  If have acute worsening of symptoms please go to ER/urgent care for further evaluation. Check pulse oximetry and if below 90-92% please go to ER. The following supplements MAY help:  Vitamin C 500mg  twice a day and Quercetin 250-500 mg twice a day Vitamin D3 2000 - 4000 u/day B Complex vitamins Zinc 75-100 mg/day Melatonin 6-10 mg at night (the optimal dose is unknown) Aspirin 81mg /day (if no history of bleeding issues)

## 2020-06-02 LAB — SARS CORONAVIRUS 2 (TAT 6-24 HRS): SARS Coronavirus 2: NEGATIVE

## 2020-11-10 IMAGING — DX DG CHEST 2V
2 series · 2 of 2 positions shown · non-contrast
Comparison: 10/21/2016

CLINICAL DATA: Left-sided chest pain for 2 months. Recreational
smoker.

EXAM:
CHEST - 2 VIEW

[chest pa]
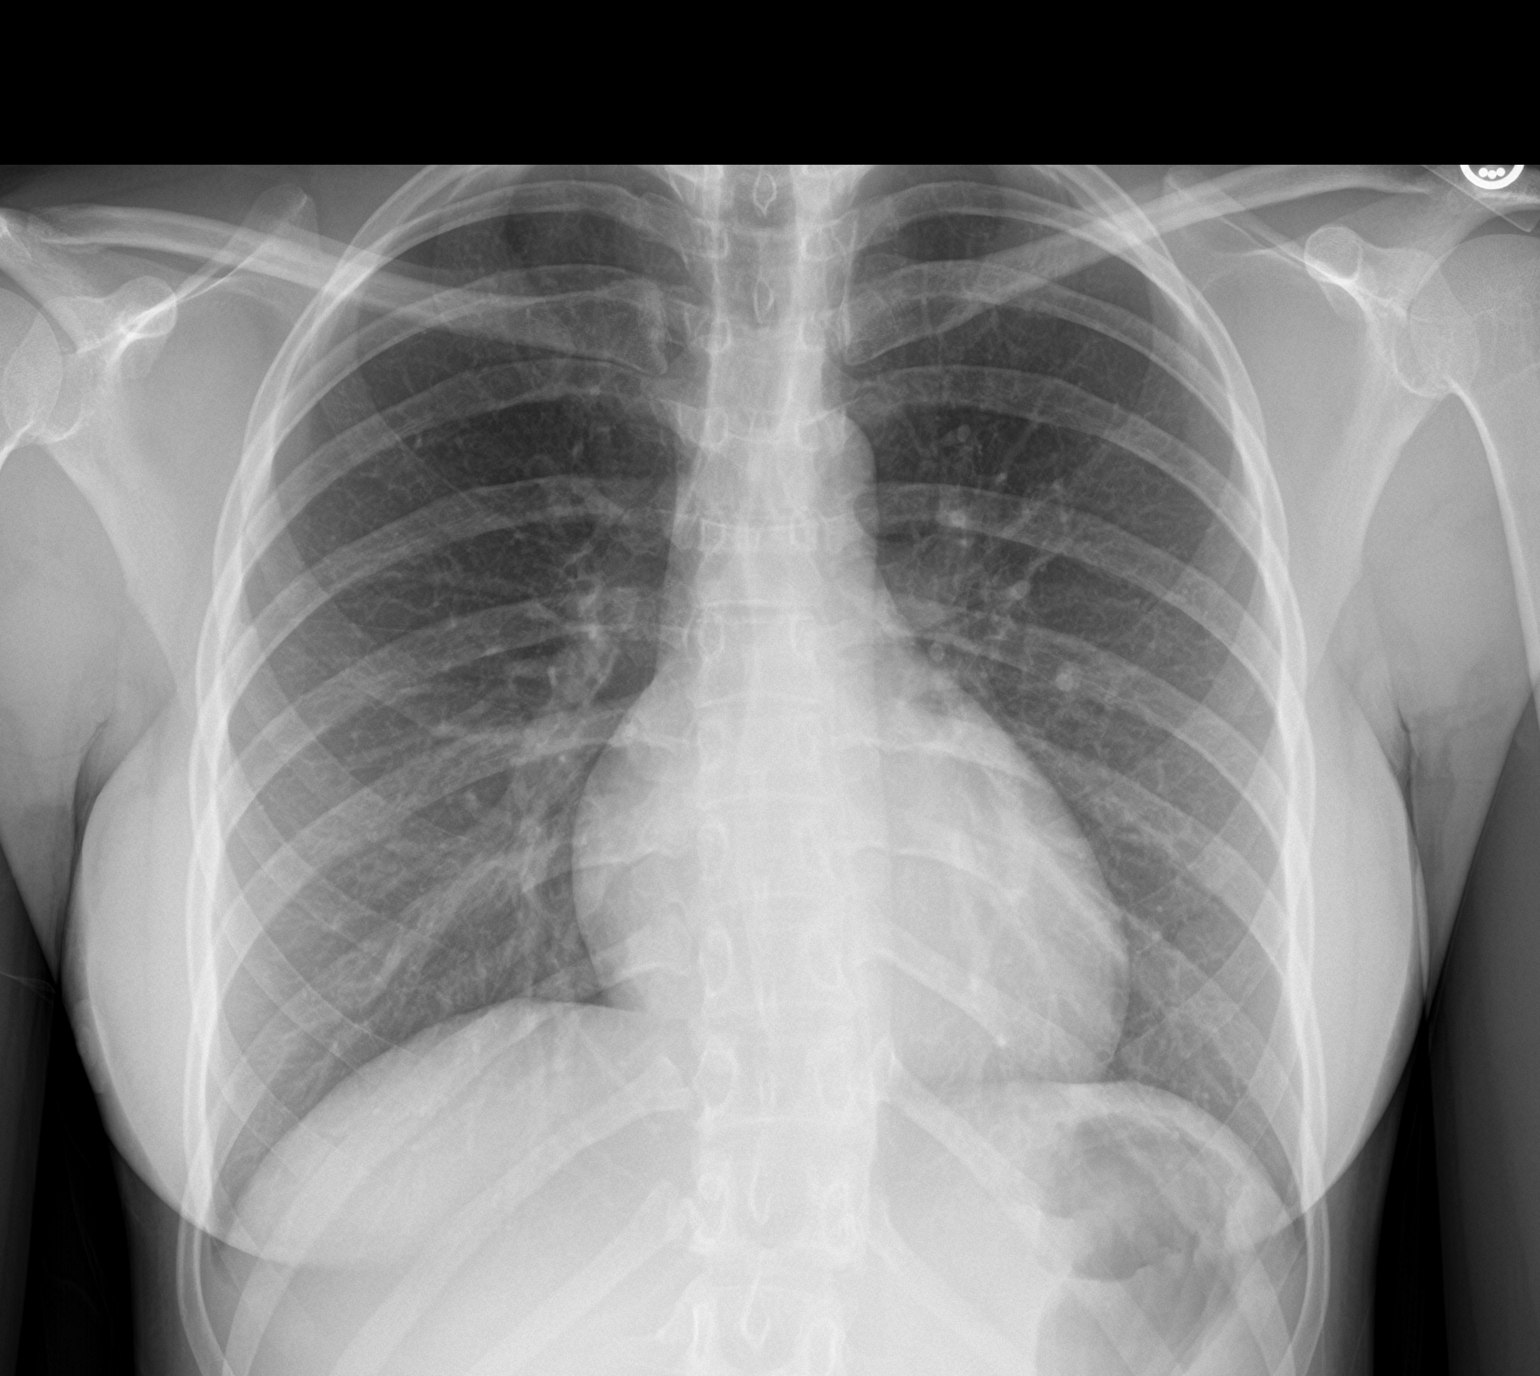

[chest lat]
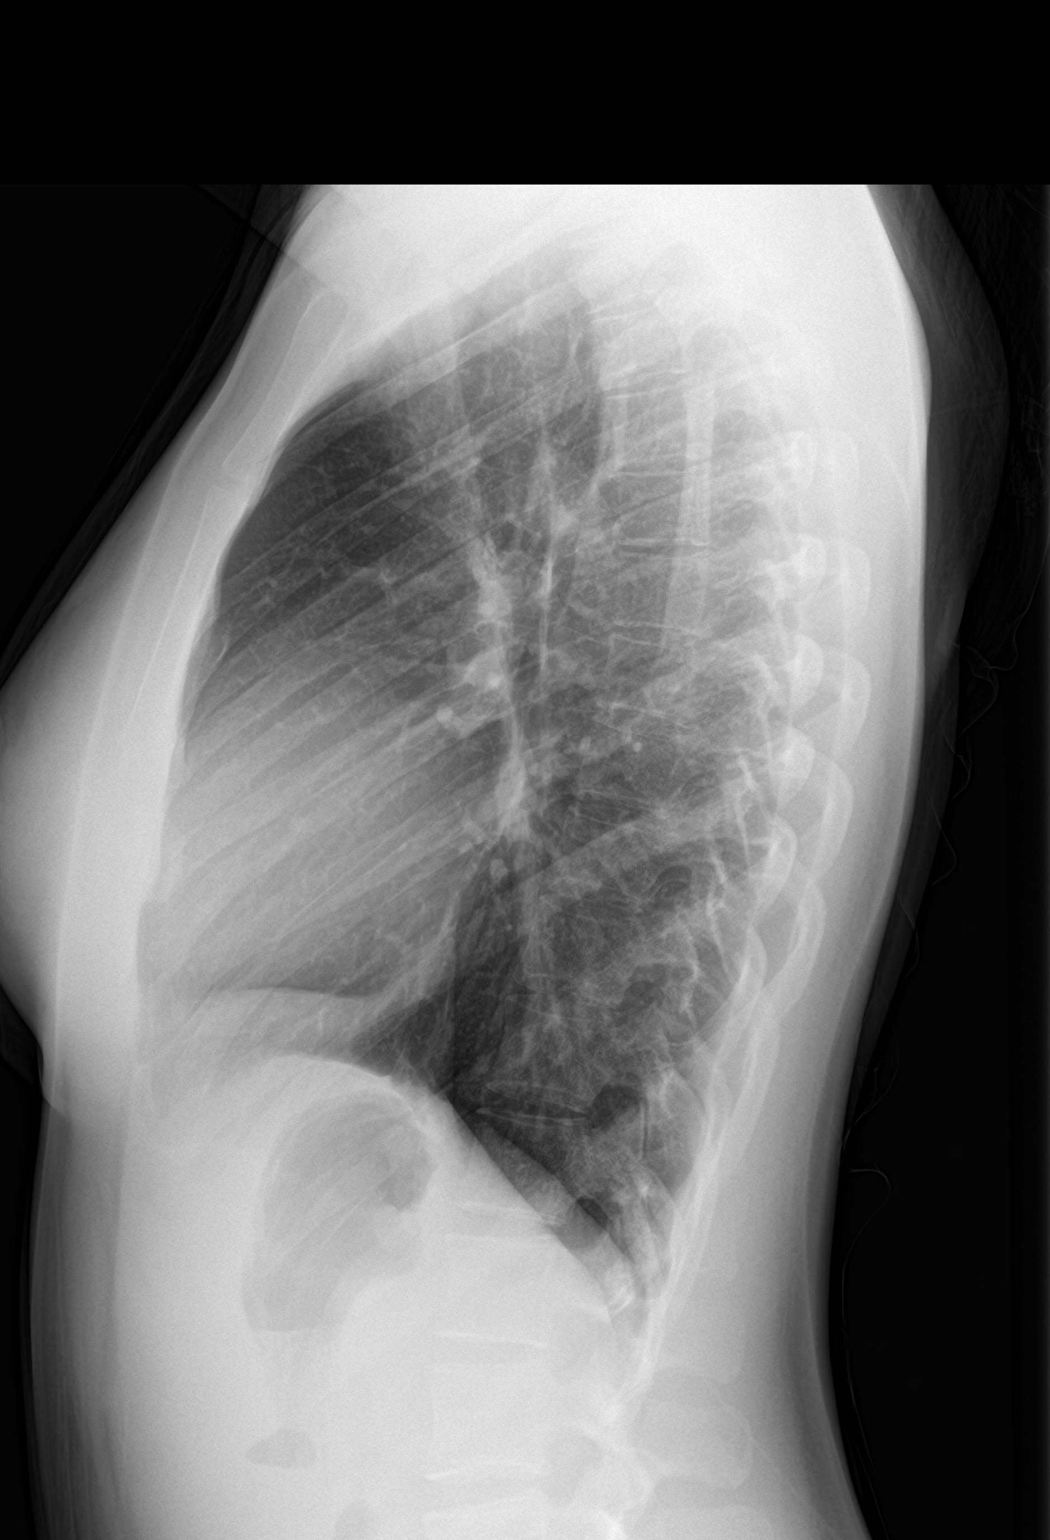

[2 of 2 positions shown; findings below may reference images not displayed]

FINDINGS: Midline trachea.  Normal heart size and mediastinal contours.

Sharp costophrenic angles.  No pneumothorax.  Clear lungs.
IMPRESSION: No active cardiopulmonary disease.
